# Patient Record
Sex: Female | Born: 1995 | Race: Black or African American | Hispanic: No | Marital: Single | Smoking: Never smoker
Health system: Southern US, Community
[De-identification: ages and names within clinical notes are randomized; demographics above are authoritative.]

## PROBLEM LIST (undated history)

## (undated) DIAGNOSIS — J45909 Unspecified asthma, uncomplicated: Secondary | ICD-10-CM

---

## 2016-08-13 ENCOUNTER — Ambulatory Visit (HOSPITAL_COMMUNITY)
Admission: EM | Admit: 2016-08-13 | Discharge: 2016-08-13 | Disposition: A | Discharge status: Home / Self Care | Payer: 59 | Attending: Internal Medicine | Admitting: Internal Medicine

## 2016-08-13 ENCOUNTER — Encounter (HOSPITAL_COMMUNITY): Payer: Self-pay | Admitting: Emergency Medicine

## 2016-08-13 ENCOUNTER — Ambulatory Visit (INDEPENDENT_AMBULATORY_CARE_PROVIDER_SITE_OTHER): Payer: 59

## 2016-08-13 DIAGNOSIS — R05 Cough: Secondary | ICD-10-CM

## 2016-08-13 DIAGNOSIS — R0981 Nasal congestion: Secondary | ICD-10-CM

## 2016-08-13 DIAGNOSIS — R059 Cough, unspecified: Secondary | ICD-10-CM

## 2016-08-13 HISTORY — DX: Unspecified asthma, uncomplicated: J45.909

## 2016-08-13 MED ORDER — CETIRIZINE HCL 10 MG PO TABS: 10.0000 mg | ORAL_TABLET | Freq: Every day | ORAL | 0 refills | Status: DC

## 2016-08-13 MED ORDER — FLUTICASONE PROPIONATE 50 MCG/ACT NA SUSP: 2.0000 | Freq: Every day | NASAL | 2 refills | Status: DC

## 2016-08-13 NOTE — ED Provider Notes (Signed)
CSN: [id number]     Arrival date & time 08/13/16  1704 History   First MD Initiated Contact with Patient 08/13/16 1748     Chief Complaint  Patient presents with  . URI   (Consider location/radiation/quality/duration/timing/severity/associated sxs/prior Treatment) HPI  Alexis Parrish is a 20 y.o. female presenting to UC with c/o 2 weeks rhinorrhea, chest congestion and mild intermittent production of yellow/green phlegm .  Pt has hx of generalized headache and is concerned symptoms are due to mold in her dorm room.  She has been using her albuterol inhaler but no other medications. Denies fever, chills, n/v/d. No other sick contacts.    Past Medical History:  Diagnosis Date  . Asthma    History reviewed. No pertinent surgical history. No family history on file. Social History  Substance Use Topics  . Smoking status: Never Smoker  . Smokeless tobacco: Never Used  . Alcohol use No   OB History    No data available     Review of Systems  Constitutional: Negative for chills and fever.  HENT: Positive for congestion and rhinorrhea. Negative for ear pain, sinus pressure, sore throat, trouble swallowing and voice change.   Respiratory: Positive for cough, shortness of breath and wheezing. Negative for chest tightness.   Cardiovascular: Negative for chest pain and palpitations.  Gastrointestinal: Negative for abdominal pain, diarrhea, nausea and vomiting.  Musculoskeletal: Negative for arthralgias, back pain and myalgias.  Skin: Negative for rash.  Neurological: Positive for headaches. Negative for dizziness and light-headedness.    Allergies  Review of patient's allergies indicates no known allergies.  Home Medications   Prior to Admission medications   Medication Sig Start Date End Date Taking? Authorizing Provider  ALBUTEROL IN Inhale into the lungs.   Yes Historical Provider, MD  Norgestim-Eth Estrad Triphasic (TRI-SPRINTEC PO) Take by mouth.   Yes Historical Provider, MD   cetirizine (ZYRTEC) 10 MG tablet Take 1 tablet (10 mg total) by mouth daily. 08/13/16   Junius FinnerErin O'Malley, PA-C  fluticasone (FLONASE) 50 MCG/ACT nasal spray Place 2 sprays into both nostrils daily. 08/13/16   Junius FinnerErin O'Malley, PA-C   Meds Ordered and Administered this Visit  Medications - No data to display  BP 130/87 (BP Location: Left Arm)   Pulse 79   Temp 98.4 F (36.9 C) (Oral)   Resp 12   LMP 08/05/2016   SpO2 97%  No data found.   Physical Exam  Constitutional: She appears well-developed and well-nourished. No distress.  Pt sitting on exam bed, NAD  HENT:  Head: Normocephalic and atraumatic.  Right Ear: Tympanic membrane normal.  Left Ear: Tympanic membrane normal.  Nose: Nose normal. Right sinus exhibits no maxillary sinus tenderness and no frontal sinus tenderness. Left sinus exhibits no maxillary sinus tenderness and no frontal sinus tenderness.  Mouth/Throat: Uvula is midline, oropharynx is clear and moist and mucous membranes are normal.  Eyes: Conjunctivae are normal. No scleral icterus.  Neck: Normal range of motion. Neck supple.  Cardiovascular: Normal rate, regular rhythm and normal heart sounds.   Pulmonary/Chest: Effort normal and breath sounds normal. No stridor. No respiratory distress. She has no wheezes. She has no rales. She exhibits no tenderness.  Lungs: CTAB, no respiratory distress.  No coughing during exam.  Abdominal: Soft. There is no tenderness.  Musculoskeletal: Normal range of motion.  Lymphadenopathy:    She has no cervical adenopathy.  Neurological: She is alert.  Skin: Skin is warm and dry. She is not diaphoretic.  Nursing note  and vitals reviewed.   Urgent Care Course   Clinical Course    Procedures (including critical care time)  Labs Review Labs Reviewed - No data to display  Imaging Review Dg Chest 2 View  Result Date: 08/13/2016 CLINICAL DATA:  Dizziness, chest pain and shortness of breath. EXAM: CHEST  2 VIEW COMPARISON:  None.  FINDINGS: Cardiomediastinal contours are normal. No pneumothorax or sizable pleural effusion. No focal airspace consolidation or pulmonary edema. IMPRESSION: Clear lungs. Electronically Signed   By: Deatra Robinson M.D.   On: 08/13/2016 18:30     MDM   1. Cough   2. Nasal congestion    Pt c/o cough and congestion for about 2 weeks. Lungs: CTAB. No sinus tenderness on exam. Pt appears well and comfortable in exam room. Afebrile.  CXR: clear lungs. No evidence of bacterial infection at this time.  Pt declined cough medication. Does not need refill on albuterol.  Will prescribe Flonase and cetirizine in case symptoms secondary to allergies from reported mold or other environmental triggers.  F/u with PCP in 1 week if not improving, sooner if worsening.     Junius Finner, PA-C 08/13/16 1840

## 2016-08-13 NOTE — ED Triage Notes (Signed)
Patient reports over the last 2 weeks has had runny nose, chest congestion reported as yellow/green phlegm.  Patient also has had a headache.  Patient is concerned symptoms related to finding mold in her dorm room.

## 2016-09-07 ENCOUNTER — Ambulatory Visit (HOSPITAL_COMMUNITY)
Admission: EM | Admit: 2016-09-07 | Discharge: 2016-09-07 | Disposition: A | Discharge status: Home / Self Care | Payer: 59 | Attending: Emergency Medicine | Admitting: Emergency Medicine

## 2016-09-07 ENCOUNTER — Encounter (HOSPITAL_COMMUNITY): Payer: Self-pay | Admitting: *Deleted

## 2016-09-07 DIAGNOSIS — Z79899 Other long term (current) drug therapy: Secondary | ICD-10-CM | POA: Insufficient documentation

## 2016-09-07 DIAGNOSIS — J029 Acute pharyngitis, unspecified: Secondary | ICD-10-CM | POA: Diagnosis not present

## 2016-09-07 DIAGNOSIS — J45909 Unspecified asthma, uncomplicated: Secondary | ICD-10-CM | POA: Insufficient documentation

## 2016-09-07 DIAGNOSIS — Z7951 Long term (current) use of inhaled steroids: Secondary | ICD-10-CM | POA: Insufficient documentation

## 2016-09-07 DIAGNOSIS — K219 Gastro-esophageal reflux disease without esophagitis: Secondary | ICD-10-CM | POA: Insufficient documentation

## 2016-09-07 DIAGNOSIS — J0140 Acute pansinusitis, unspecified: Secondary | ICD-10-CM

## 2016-09-07 LAB — POCT RAPID STREP A: STREPTOCOCCUS, GROUP A SCREEN (DIRECT): NEGATIVE

## 2016-09-07 MED ORDER — AZELASTINE HCL 0.1 % NA SOLN: 2.0000 | Freq: Two times a day (BID) | NASAL | 0 refills | Status: DC

## 2016-09-07 MED ORDER — AMOXICILLIN-POT CLAVULANATE 875-125 MG PO TABS: 1.0000 | ORAL_TABLET | Freq: Two times a day (BID) | ORAL | 0 refills | Status: DC

## 2016-09-07 MED ORDER — IBUPROFEN 800 MG PO TABS: 800.0000 mg | ORAL_TABLET | Freq: Three times a day (TID) | ORAL | 0 refills | Status: DC

## 2016-09-07 NOTE — ED Provider Notes (Signed)
HPI  SUBJECTIVE:  Patient reports sore throat starting 3 days ago. Sx worse with swallowing.  Sx better with nothing. Has been taking ibuprofen 400 mg, TheraFlu w/ o relief.  No fevers No Swollen neck glands    No Cough + Nasal congestion, clear rhinorrhea, postnasal drip, sneezing, sinus pain or pressure, upper dental pain. Sinus pain and pressure worse with bending forward and lying down. Patient states that she feels like her face is swollen. No Myalgias + Frontal sinus Headache No Rash     No Recent Strep or mono Exposure No Abdominal Pain No reflux sxs No Allergy sxs  No Breathing difficulty, voice changes sensation in her throat swelling shut No Drooling No Trismus No abx in past month.  No antipyretic in past 4-6 hrs She has a past medical GERD for which she takes Zantac. States that it is not bothering her at this time Also history of strep, allergies. No history of mono, diabetes, hypertension  LMP: 10/14. Denies possibility of being pregnant  PMD: Dr. Beverlyn Roux at [facility] in [place].     Past Medical History:  Diagnosis Date  . Asthma     History reviewed. No pertinent surgical history.  No family history on file.  Social History  Substance Use Topics  . Smoking status: Never Smoker  . Smokeless tobacco: Never Used  . Alcohol use No    No current facility-administered medications for this encounter.   Current Outpatient Prescriptions:  .  ALBUTEROL IN, Inhale into the lungs., Disp: , Rfl:  .  cetirizine (ZYRTEC) 10 MG tablet, Take 1 tablet (10 mg total) by mouth daily., Disp: 30 tablet, Rfl: 0 .  fluticasone (FLONASE) 50 MCG/ACT nasal spray, Place 2 sprays into both nostrils daily., Disp: 15.8 g, Rfl: 2 .  Norgestim-Eth Estrad Triphasic (TRI-SPRINTEC PO), Take by mouth., Disp: , Rfl:   No Known Allergies   ROS  As noted in HPI.   Physical Exam  BP 130/83 (BP Location: Left Arm)   Pulse 80   Temp 98.3 F (36.8 C) (Oral)   Resp 14   LMP  08/05/2016   SpO2 100%   Constitutional: Well developed, well nourished, no acute distress Eyes:  EOMI, conjunctiva normal bilaterally HENT: Normocephalic, atraumatic,mucus membranes moist. + clear nasal congestion + red swollen turbinates + maxillary and frontal sinus tenderness  - erythematous oropharynx  - enlarged tonsils  - exudates. Uvula midline.  Respiratory: Normal inspiratory effort Cardiovascular: Normal rate, no murmurs, rubs, gallops GI: nondistended, nontender. No appreciable splenomegaly skin: No rash, skin intact Lymph: + shotty cervical LN  Musculoskeletal: no deformities Neurologic: Alert & oriented x 3, no focal neuro deficits Psychiatric: Speech and behavior appropriate.   ED Course   Medications - No data to display  Orders Placed This Encounter  Procedures  . POCT rapid strep A [facility])    Standing Status:   Standing    Number of Occurrences:   1    Results for orders placed or performed during the hospital encounter of 09/07/16 (from the past 24 hour(s))  POCT rapid strep A [facility])     Status: None   Collection Time: 09/07/16  3:08 PM  Result Value Ref Range   Streptococcus, Group A Screen (Direct) NEGATIVE NEGATIVE   No results found.  ED Clinical Impression  No diagnosis found.   ED Assessment/Plan  Rapid strep negative. Obtaining throat culture to guide antibiotic treatment. Discussed this with patient.   Patient with a  sinusitis, and pharyngitis, most likely viral. Sending home with Mucinex D, saline nasal irrigation, ibuprofen 800 mg, Astelin nasal spray since she has not yet tried this. History tried Nasonex and Flonase and they do not work for her. Wait-and-see prescription of Augmentin to cover sinusitis. This  will also cover pharyngitis. Discussed indications for starting antibiotics. She'll follow-up with her doctor as needed.  Discussed labs, MDM, plan and followup with patient. Discussed sn/sx that should prompt return  to the ED. Patient  agrees with plan.   *This clinic note was created using Dragon dictation software. Therefore, there may be occasional mistakes despite careful proofreading.    Domenick GongAshley Abrielle Finck, MD 09/08/16 1114

## 2016-09-07 NOTE — Discharge Instructions (Signed)
Take mucinex D. Afrin at night for 3 days only. You may take 800 mg of motrin with 1 gram of tylenol up to 3 times a day as needed for pain. This is an effective combination for pain.  Most sinus infections are viral and do not need antibiotics unless you have a high fever, have had this for 10 days, or you get better and then get sick again. Use a neti pot or the NeilMed sinus rinse as often as you want to to reduce nasal congestion. Follow the directions on the box.    your rapid strep was negative today, so we have sent off a throat culture.  We will contact you and call in the appropriate antibiotics if your culture comes back positive for an infection requiring antibiotic treatment.  Give us a working phone number.  If you were given a prescription for antibiotics, you may want to wait and fill it until you know the results of the culture.  Tylenol and ibuprofen together as needed for pain.  Make sure you drink plenty of extra fluids.  Some people find salt water gargles and  Traditional Medicinal's "Throat Coat" tea helpful. Take 5 mL of liquid Benadryl and 5 mL of Maalox. Mix it together, and then hold it in your mouth for as long as you can and then swallow. You may do this 4 times a day.    Go to www.goodrx.com to look up your medications. This will give you a list of where you can find your prescriptions at the most affordable prices.

## 2016-09-07 NOTE — ED Triage Notes (Signed)
Pt     Reports    sorethroat       And   Swollen  Glands  r  Side  Of  Her  Neck   With    Onset   Of  Symptoms  X  3  Days

## 2016-09-10 LAB — CULTURE, GROUP A STREP (THRC)

## 2017-11-08 IMAGING — DX DG CHEST 2V
2 series · 2 of 2 positions shown · non-contrast
Comparison: None.

CLINICAL DATA: Dizziness, chest pain and shortness of breath.

EXAM:
CHEST  2 VIEW

[chest pa]
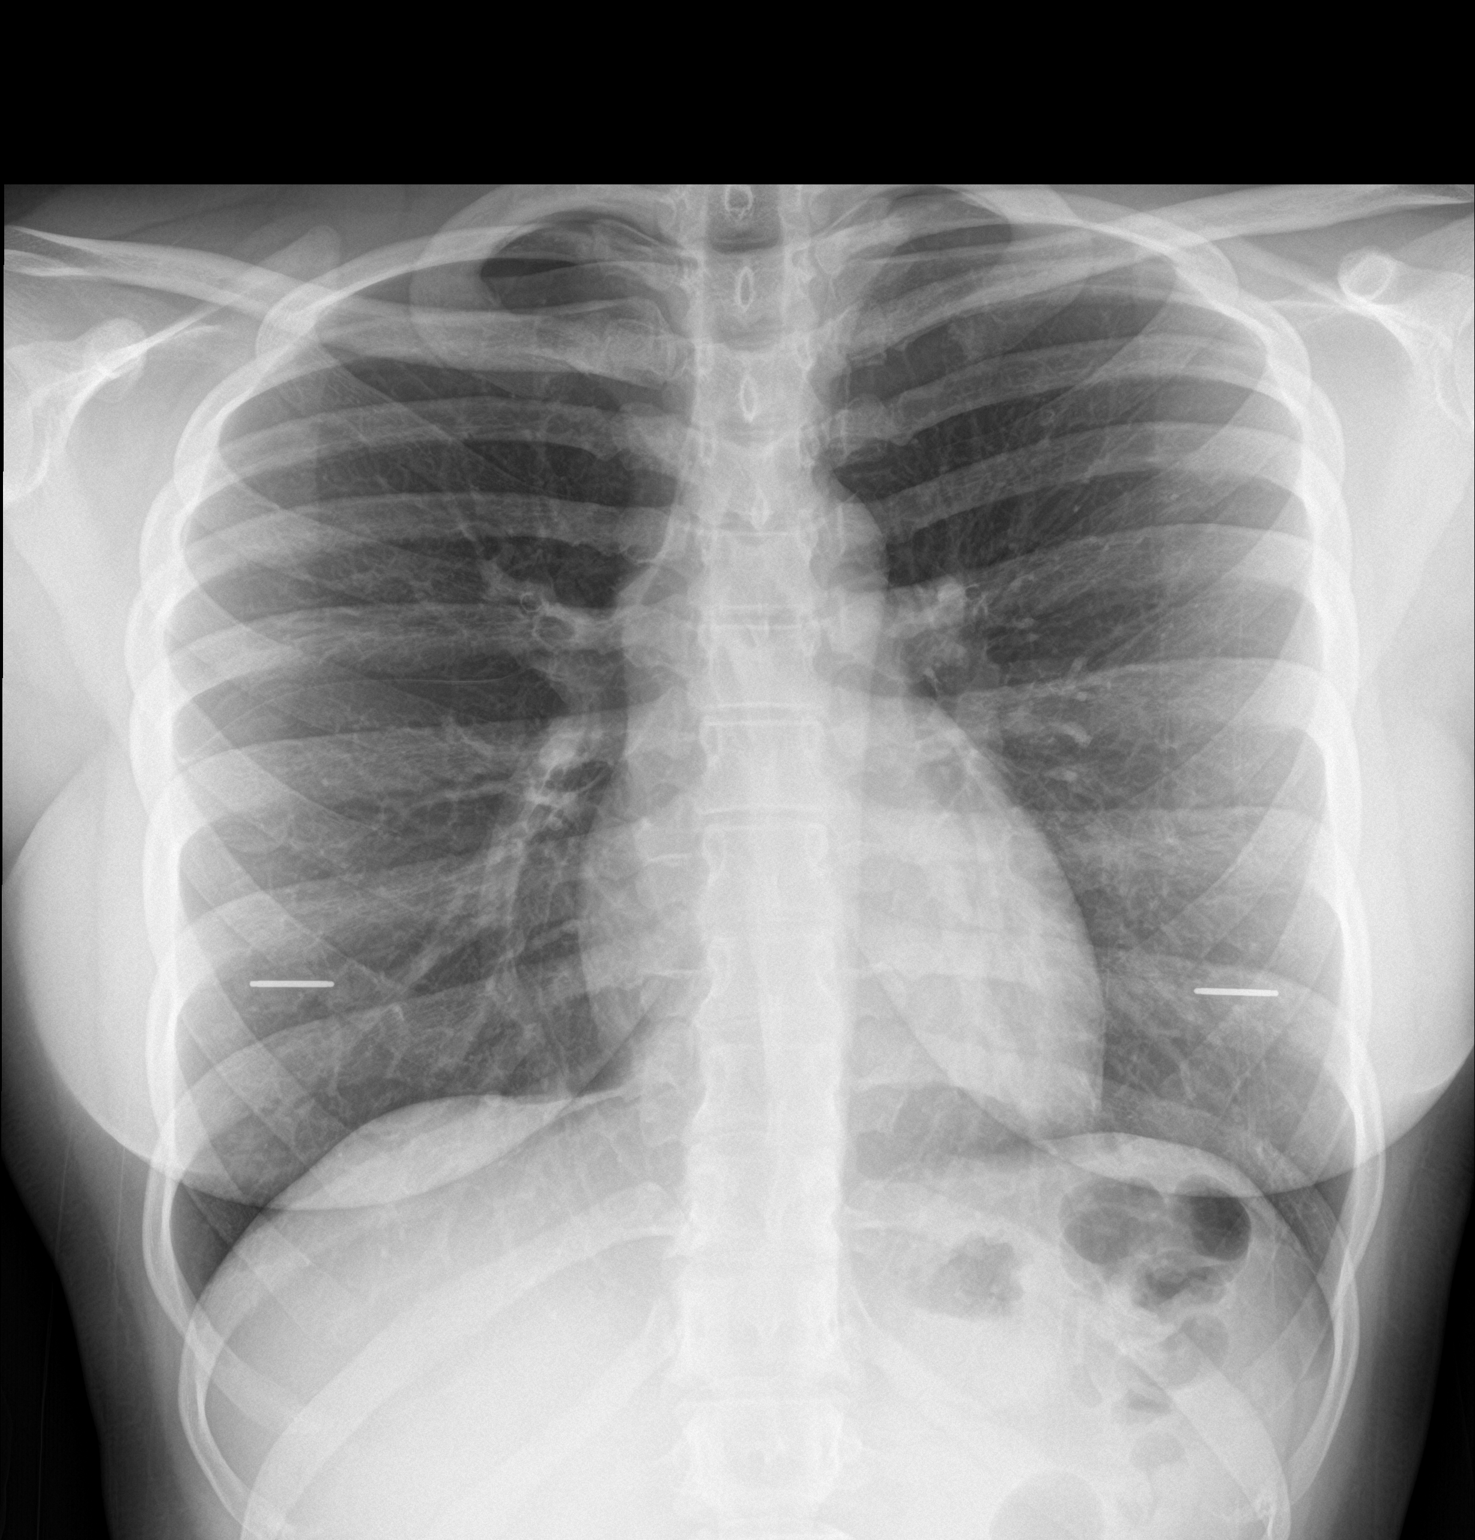

[chest lat]
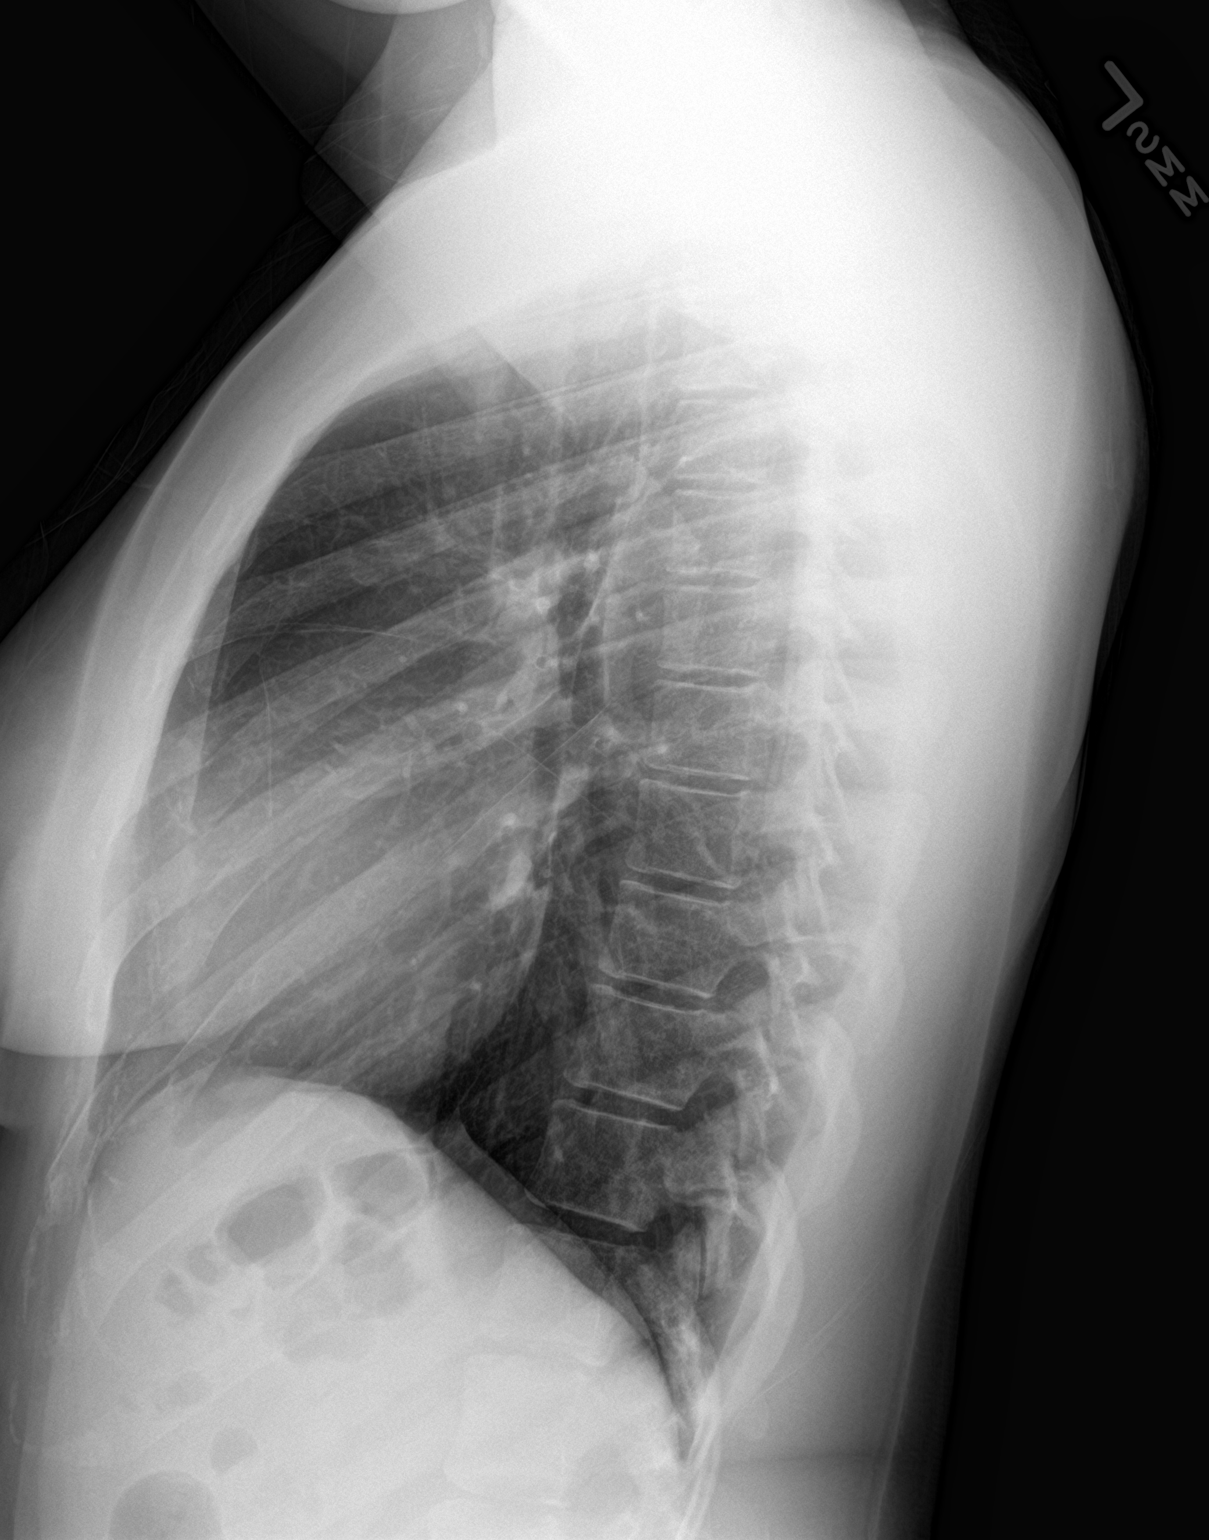

[2 of 2 positions shown; findings below may reference images not displayed]

FINDINGS: Cardiomediastinal contours are normal. No pneumothorax or sizable
pleural effusion.

No focal airspace consolidation or pulmonary edema.
IMPRESSION: Clear lungs.

## 2018-06-04 ENCOUNTER — Other Ambulatory Visit: Payer: Self-pay

## 2018-06-04 ENCOUNTER — Emergency Department (HOSPITAL_COMMUNITY)
Admission: EM | Admit: 2018-06-04 | Discharge: 2018-06-04 | Disposition: A | Discharge status: Home / Self Care | Payer: 59 | Attending: Emergency Medicine | Admitting: Emergency Medicine

## 2018-06-04 ENCOUNTER — Emergency Department (HOSPITAL_COMMUNITY): Payer: 59

## 2018-06-04 ENCOUNTER — Encounter (HOSPITAL_COMMUNITY): Payer: Self-pay | Admitting: Emergency Medicine

## 2018-06-04 DIAGNOSIS — Z79899 Other long term (current) drug therapy: Secondary | ICD-10-CM | POA: Insufficient documentation

## 2018-06-04 DIAGNOSIS — J45909 Unspecified asthma, uncomplicated: Secondary | ICD-10-CM | POA: Insufficient documentation

## 2018-06-04 DIAGNOSIS — M7918 Myalgia, other site: Secondary | ICD-10-CM | POA: Insufficient documentation

## 2018-06-04 LAB — PREGNANCY, URINE: PREG TEST UR: NEGATIVE

## 2018-06-04 MED ORDER — IBUPROFEN 600 MG PO TABS: 600.0000 mg | ORAL_TABLET | Freq: Four times a day (QID) | ORAL | 0 refills | Status: AC | PRN

## 2018-06-04 MED ORDER — IBUPROFEN 200 MG PO TABS
600.0000 mg | ORAL_TABLET | Freq: Once | ORAL | Status: AC
  Administered 2018-06-04: 600 mg via ORAL
  Filled 2018-06-04: qty 3

## 2018-06-04 MED ORDER — CYCLOBENZAPRINE HCL 10 MG PO TABS: 10.0000 mg | ORAL_TABLET | Freq: Two times a day (BID) | ORAL | 0 refills | Status: AC | PRN
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
